# Patient Record
Sex: Male | Born: 2000 | Race: Black or African American | Hispanic: No | Marital: Single | Smoking: Never smoker
Health system: Southern US, Community
[De-identification: ages and names within clinical notes are randomized; demographics above are authoritative.]

## PROBLEM LIST (undated history)

## (undated) DIAGNOSIS — J302 Other seasonal allergic rhinitis: Secondary | ICD-10-CM

---
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
## 2024-06-10 ENCOUNTER — Encounter: Payer: Self-pay | Admitting: Emergency Medicine

## 2024-06-10 ENCOUNTER — Ambulatory Visit
Admission: EM | Admit: 2024-06-10 | Discharge: 2024-06-10 | Disposition: A | Discharge status: Home / Self Care | Attending: Physician Assistant | Admitting: Physician Assistant

## 2024-06-10 DIAGNOSIS — B349 Viral infection, unspecified: Secondary | ICD-10-CM | POA: Diagnosis not present

## 2024-06-10 DIAGNOSIS — J029 Acute pharyngitis, unspecified: Secondary | ICD-10-CM

## 2024-06-10 DIAGNOSIS — R6883 Chills (without fever): Secondary | ICD-10-CM | POA: Diagnosis not present

## 2024-06-10 DIAGNOSIS — R112 Nausea with vomiting, unspecified: Secondary | ICD-10-CM

## 2024-06-10 LAB — POC COVID19/FLU A&B COMBO
Covid Antigen, POC: NEGATIVE
Influenza A Antigen, POC: NEGATIVE
Influenza B Antigen, POC: NEGATIVE

## 2024-06-10 LAB — POCT RAPID STREP A (OFFICE): Rapid Strep A Screen: NEGATIVE

## 2024-06-10 MED ORDER — ONDANSETRON 4 MG PO TBDP: 4.0000 mg | ORAL_TABLET | Freq: Four times a day (QID) | ORAL | 0 refills | Status: AC | PRN

## 2024-06-10 MED ORDER — ONDANSETRON 8 MG PO TBDP
8.0000 mg | ORAL_TABLET | Freq: Once | ORAL | Status: AC
  Administered 2024-06-10: 8 mg via ORAL

## 2024-06-10 NOTE — ED Triage Notes (Signed)
 Pt c/o emesis, chills. Started today. Denies diarrhea. He has lower abdominal pain also.

## 2024-06-10 NOTE — Discharge Instructions (Signed)
-  Negative flu, COVI Dand strep  URI/COLD SYMPTOMS: Your exam today is consistent with a viral illness. Antibiotics are not indicated at this time. Use medications as directed, including cough syrup, nasal saline, and decongestants. Your symptoms should improve over the next few days and resolve within 7-10 days. Increase rest and fluids. F/u if symptoms worsen or predominate such as sore throat, ear pain, productive cough, shortness of breath, or if you develop high fevers or worsening fatigue over the next several days.    ABDOMINAL PAIN: You may take Tylenol  for pain relief. Use medications as directed including antiemetics and antidiarrheal medications if suggested or prescribed. You should increase fluids and electrolytes as well as rest over these next several days. If you have any questions or concerns, or if your symptoms are not improving or if especially if they acutely worsen, please call or stop back to the clinic immediately and we will be happy to help you or go to the ER   ABDOMINAL PAIN RED FLAGS: Seek immediate further care if: symptoms remain the same or worsen over the next 3-7 days, you are unable to keep fluids down, you see blood or mucus in your stool, you vomit black or dark red material, you have a fever of 101.F or higher, you have localized and/or persistent abdominal pain

## 2024-06-10 NOTE — ED Provider Notes (Signed)
 [facility]    CSN: [id number] Arrival date & time: 06/10/24  1554      History   Chief Complaint Chief Complaint  Patient presents with   Emesis    HPI AIMAR BORGHI is a 23 y.o. male presenting for fatigue, cough, congestion, sore throat, nausea, vomiting and chills that began today. Denies fever, ear pain, sinus pain, chest pain, wheezing, shortness of breath, abdominal pain, or diarrhea. Patient denies sick contacts. Patient has not been taking any over-the-counter meds. No other complaints.   HPI  Past Medical History:  Diagnosis Date   Seasonal allergies     There are no active problems to display for this patient.   History reviewed. No pertinent surgical history.     Home Medications    Prior to Admission medications   Medication Sig Start Date End Date Taking? Authorizing Provider  ondansetron (ZOFRAN-ODT) 4 MG disintegrating tablet Take 1 tablet (4 mg total) by mouth every 6 (six) hours as needed for nausea or vomiting. 06/10/24  Yes Arvis Huxley B, PA-C  loratadine (CLARITIN) 10 MG tablet Take 10 mg by mouth daily as needed for allergies.    [provider]    Family History Family History  Problem Relation Age of Onset   Hypertension Mother    Diabetes Father    Hypertension Father    Congestive Heart Failure Father    Colon cancer Father     Social History Social History   Tobacco Use   Smoking status: Never   Smokeless tobacco: Never  Vaping Use   Vaping status: Every Day  Substance Use Topics   Alcohol use: No   Drug use: Never     Allergies   Patient has no known allergies.   Review of Systems Review of Systems  Constitutional:  Positive for appetite change, chills and fatigue. Negative for fever.  HENT:  Positive for congestion, rhinorrhea and sore throat. Negative for sinus pain.   Respiratory:  Positive for cough. Negative for shortness of breath.   Cardiovascular:  Negative for chest pain.   Gastrointestinal:  Positive for nausea and vomiting. Negative for abdominal pain and diarrhea.  Musculoskeletal:  Negative for myalgias.  Neurological:  Negative for weakness, light-headedness and headaches.  Hematological:  Negative for adenopathy.     Physical Exam Triage Vital Signs ED Triage Vitals  Encounter Vitals Group     BP 06/10/24 1619 136/82     Girls Systolic BP Percentile --      Girls Diastolic BP Percentile --      Boys Systolic BP Percentile --      Boys Diastolic BP Percentile --      Pulse Rate 06/10/24 1619 (!) 53     Resp 06/10/24 1619 16     Temp 06/10/24 1619 98.7 F (37.1 C)     Temp Source 06/10/24 1619 Oral     SpO2 06/10/24 1619 96 %     Weight 06/10/24 1618 188 lb 4.4 oz (85.4 kg)     Height 06/10/24 1618 5' 8 (1.727 m)     Head Circumference --      Peak Flow --      Pain Score 06/10/24 1618 7     Pain Loc --      Pain Education --      Exclude from Growth Chart --    No data found.  Updated Vital Signs BP 136/82 (BP Location: Right Arm)   Pulse (!) 53  Temp 98.7 F (37.1 C) (Oral)   Resp 16   Ht 5' 8 (1.727 m)   Wt 188 lb 4.4 oz (85.4 kg)   SpO2 96%   BMI 28.63 kg/m       Physical Exam Vitals and nursing note reviewed.  Constitutional:      General: He is not in acute distress.    Appearance: Normal appearance. He is well-developed. He is ill-appearing.  HENT:     Head: Normocephalic and atraumatic.     Nose: Congestion present.     Mouth/Throat:     Mouth: Mucous membranes are moist.     Pharynx: Posterior oropharyngeal erythema present.     Comments: Petechial hemorrhage of soft palate. Eyes:     General: No scleral icterus.    Conjunctiva/sclera: Conjunctivae normal.  Cardiovascular:     Rate and Rhythm: Regular rhythm. Bradycardia present.  Pulmonary:     Effort: Pulmonary effort is normal. No respiratory distress.     Breath sounds: Normal breath sounds.  Abdominal:     Palpations: Abdomen is soft.      Tenderness: There is no abdominal tenderness.  Musculoskeletal:     Cervical back: Neck supple.  Skin:    General: Skin is warm and dry.     Capillary Refill: Capillary refill takes less than 2 seconds.  Neurological:     General: No focal deficit present.     Mental Status: He is alert. Mental status is at baseline.     Motor: No weakness.     Gait: Gait normal.  Psychiatric:        Mood and Affect: Mood normal.        Behavior: Behavior normal.      UC Treatments / Results  Labs (all labs ordered are listed, but only abnormal results are displayed) Labs Reviewed  POC COVID19/FLU A&B COMBO - Normal  POCT RAPID STREP A (OFFICE)    EKG   Radiology No results found.  Procedures Procedures (including critical care time)  Medications Ordered in UC Medications  ondansetron (ZOFRAN-ODT) disintegrating tablet 8 mg (8 mg Oral Given 06/10/24 1631)    Initial Impression / Assessment and Plan / UC Course  I have reviewed the triage vital signs and the nursing notes.  Pertinent labs & imaging results that were available during my care of the patient were reviewed by me and considered in my medical decision making (see chart for details).   23 year old male presents for onset of fatigue, chills, nausea/vomiting, cough, congestion and sore throat this morning.  Denies fever, abdominal pain, chest pain or shortness of breath.  Ill-appearing but nontoxic.  Afebrile.  On exam has mild nasal congestion and erythema posterior pharynx.  Chest clear.  Heart regular rhythm.  Abdomen soft and nontender.  Rapid strep negative.  Flu/COVID antigen test performed. Negative.  Patient given 8 mg ODT Zofran in clinic.  Viral acute illness with systemic symptoms. Supportive care advised. Sent Zofran ODT to pharmacy. Reviewed bland diet. Discussed return precautions.    Final Clinical Impressions(s) / UC Diagnoses   Final diagnoses:  Chills  Viral illness  Nausea and vomiting,  unspecified vomiting type  Sore throat     Discharge Instructions      -Negative flu, COVI Dand strep  URI/COLD SYMPTOMS: Your exam today is consistent with a viral illness. Antibiotics are not indicated at this time. Use medications as directed, including cough syrup, nasal saline, and decongestants. Your symptoms should improve over the next  few days and resolve within 7-10 days. Increase rest and fluids. F/u if symptoms worsen or predominate such as sore throat, ear pain, productive cough, shortness of breath, or if you develop high fevers or worsening fatigue over the next several days.    ABDOMINAL PAIN: You may take Tylenol  for pain relief. Use medications as directed including antiemetics and antidiarrheal medications if suggested or prescribed. You should increase fluids and electrolytes as well as rest over these next several days. If you have any questions or concerns, or if your symptoms are not improving or if especially if they acutely worsen, please call or stop back to the clinic immediately and we will be happy to help you or go to the ER   ABDOMINAL PAIN RED FLAGS: Seek immediate further care if: symptoms remain the same or worsen over the next 3-7 days, you are unable to keep fluids down, you see blood or mucus in your stool, you vomit black or dark red material, you have a fever of 101.F or higher, you have localized and/or persistent abdominal pain       ED Prescriptions     Medication Sig Dispense Auth. Provider   ondansetron (ZOFRAN-ODT) 4 MG disintegrating tablet Take 1 tablet (4 mg total) by mouth every 6 (six) hours as needed for nausea or vomiting. 15 tablet Saloma Cadena B, PA-C      PDMP not reviewed this encounter.   Arvis Jolan NOVAK, PA-C 06/10/24 1702
